# Patient Record
Sex: Male | Born: 1975 | Race: Black or African American | Hispanic: No | Marital: Married | Smoking: Never smoker
Health system: Southern US, Community
[De-identification: ages and names within clinical notes are randomized; demographics above are authoritative.]

## PROBLEM LIST (undated history)

## (undated) DIAGNOSIS — Z21 Asymptomatic human immunodeficiency virus [HIV] infection status: Secondary | ICD-10-CM

## (undated) DIAGNOSIS — B20 Human immunodeficiency virus [HIV] disease: Secondary | ICD-10-CM

## (undated) HISTORY — PX: ABDOMINAL SURGERY: SHX537

## (undated) HISTORY — PX: HERNIA REPAIR: SHX51

---

## 2000-04-28 ENCOUNTER — Encounter: Admission: RE | Admit: 2000-04-28 | Discharge: 2000-04-28 | Payer: Self-pay | Admitting: Internal Medicine

## 2000-04-28 ENCOUNTER — Encounter: Payer: Self-pay | Admitting: Internal Medicine

## 2016-06-14 ENCOUNTER — Emergency Department (HOSPITAL_BASED_OUTPATIENT_CLINIC_OR_DEPARTMENT_OTHER): Payer: Worker's Compensation

## 2016-06-14 ENCOUNTER — Encounter (HOSPITAL_BASED_OUTPATIENT_CLINIC_OR_DEPARTMENT_OTHER): Payer: Self-pay | Admitting: Emergency Medicine

## 2016-06-14 ENCOUNTER — Emergency Department (HOSPITAL_BASED_OUTPATIENT_CLINIC_OR_DEPARTMENT_OTHER)
Admission: EM | Admit: 2016-06-14 | Discharge: 2016-06-14 | Disposition: A | Discharge status: Home / Self Care | Payer: Worker's Compensation | Attending: Emergency Medicine | Admitting: Emergency Medicine

## 2016-06-14 DIAGNOSIS — S82034A Nondisplaced transverse fracture of right patella, initial encounter for closed fracture: Secondary | ICD-10-CM | POA: Insufficient documentation

## 2016-06-14 DIAGNOSIS — Y9389 Activity, other specified: Secondary | ICD-10-CM | POA: Insufficient documentation

## 2016-06-14 DIAGNOSIS — W228XXA Striking against or struck by other objects, initial encounter: Secondary | ICD-10-CM | POA: Insufficient documentation

## 2016-06-14 DIAGNOSIS — Y929 Unspecified place or not applicable: Secondary | ICD-10-CM | POA: Insufficient documentation

## 2016-06-14 DIAGNOSIS — Y990 Civilian activity done for income or pay: Secondary | ICD-10-CM | POA: Insufficient documentation

## 2016-06-14 DIAGNOSIS — S8991XA Unspecified injury of right lower leg, initial encounter: Secondary | ICD-10-CM | POA: Diagnosis present

## 2016-06-14 DIAGNOSIS — Z21 Asymptomatic human immunodeficiency virus [HIV] infection status: Secondary | ICD-10-CM | POA: Insufficient documentation

## 2016-06-14 HISTORY — DX: Human immunodeficiency virus (HIV) disease: B20

## 2016-06-14 HISTORY — DX: Asymptomatic human immunodeficiency virus (hiv) infection status: Z21

## 2016-06-14 MED ORDER — HYDROCODONE-ACETAMINOPHEN 5-325 MG PO TABS: 1.0000 | ORAL_TABLET | Freq: Four times a day (QID) | ORAL | 0 refills | Status: AC | PRN

## 2016-06-14 MED ORDER — IBUPROFEN 600 MG PO TABS: 600.0000 mg | ORAL_TABLET | Freq: Four times a day (QID) | ORAL | 0 refills | Status: AC | PRN

## 2016-06-14 NOTE — ED Triage Notes (Signed)
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  Pt states pain worse today.

## 2016-06-14 NOTE — ED Provider Notes (Signed)
[facility] Provider [facility]   CSN: [id number] Arrival date & time: 06/14/16  1214     History   Chief Complaint Chief Complaint  Patient presents with  . Knee Injury    HPI Trevor Roberts is a 41 y.o. male.  HPI Trevor Abidesaac D Zieske is a 41 y.o. male with history of HIV disease, presents to emergency department complaining of right knee injury. 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 He reports immediate pain but was able to finish his shift. He states right after he was unable to put any weight on his leg and developed increased pain and swelling. He denies any numbness or weakness distal to the knee. He denies any other injuries. He states he tried ice and elevation, tried taking ibuprofen, states he is still not able to bend his knee or walk on his knee so came for evaluation.   Past Medical History:  Diagnosis Date  . HIV (human immunodeficiency virus infection) (HCC)     There are no active problems to display for this patient.   Past Surgical History:  Procedure Laterality Date  . ABDOMINAL SURGERY    . HERNIA REPAIR         Home Medications    Prior to Admission medications   Medication Sig Start Date End Date Taking? Authorizing Provider  Elviteg-Cobic-Emtricit-TenofAF (GENVOYA PO) Take by mouth.   Yes Historical Provider, MD    Family History No family history on file.  Social History Social History  Substance Use Topics  . Smoking status: Never Smoker  . Smokeless tobacco: Never Used  . Alcohol use Yes     Comment: occ     Allergies   Patient has no known allergies.   Review of Systems Review of Systems  Constitutional: Negative for chills and fever.  Respiratory: Negative for cough, chest tightness and shortness of breath.   Cardiovascular: Negative for chest pain, palpitations and leg swelling.  Musculoskeletal: Positive for arthralgias and joint swelling. Negative  for myalgias, neck pain and neck stiffness.  Skin: Negative for rash.  Allergic/Immunologic: Negative for immunocompromised state.  Neurological: Negative for weakness and numbness.  All other systems reviewed and are negative.    Physical Exam Updated Vital Signs BP 124/82 (BP Location: Left Arm)   Pulse 76   Temp 99.4 F (37.4 C) (Oral)   Resp 18   Ht 5\' 4"  (1.626 m)   Wt 65.8 kg   SpO2 99%   BMI 24.89 kg/m   Physical Exam  Constitutional: He appears well-developed and well-nourished. No distress.  HENT:  Head: Normocephalic and atraumatic.  Eyes: Conjunctivae are normal.  Neck: Neck supple.  Cardiovascular: Normal rate, regular rhythm and normal heart sounds.   Pulmonary/Chest: Effort normal. No respiratory distress. He has no wheezes. He has no rales.  Musculoskeletal: He exhibits no edema.  Obvious swelling to the right knee. Tenderness to palpation over the patella bone and patellar tendon. Tenderness extends into the right quadricep muscle. Patient is unable to extend his knee actively. Full range of motion passively with pain with flexion. Negative anterior and posterior drawer signs. No laxity with medial lateral stress. Distal radial pulses intact. Normal hip and ankle.  Neurological: He is alert.  Skin: Skin is warm and dry.  Nursing note and vitals reviewed.    ED Treatments / Results  Labs (all labs ordered are listed, but only  abnormal results are displayed) Labs Reviewed - No data to display  EKG  EKG Interpretation None       Radiology Dg Knee Complete 4 Views Right  Result Date: 06/14/2016 CLINICAL DATA:  41 year old male with a history of injury EXAM: RIGHT KNEE - COMPLETE 4+ VIEW COMPARISON:  None. FINDINGS: Acute transverse fracture through the patella with associated soft tissue swelling. No acute displaced fracture of visualize fibula, tibia, or femur. No radiopaque foreign body. Joint effusion. IMPRESSION: Acute transverse fracture of the  patella with associated soft tissue swelling and joint effusion. Electronically Signed   By: Gilmer Mor D.O.   On: 06/14/2016 13:20    Procedures Procedures (including critical care time)  Medications Ordered in ED Medications - No data to display   Initial Impression / Assessment and Plan / ED Course  I have reviewed the triage vital signs and the nursing notes.  Pertinent labs & imaging results that were available during my care of the patient were reviewed by me and considered in my medical decision making (see chart for details).     Patient in emergency department with right knee injury. X-ray showing acute transverse fracture of the patella. I am concerned about patella tendon injury as well given quadricep pain and inability to extend his knee actively. He is neurovascularly intact. We'll place in a knee immobilizer. Will give Norco and ibuprofen for pain. Given referral to orthopedic specialist, explained that he will most likely require surgery to fix this injury. Patient voiced understanding.  Vitals:   06/14/16 1221  BP: 124/82  Pulse: 76  Resp: 18  Temp: 99.4 F (37.4 C)  TempSrc: Oral  SpO2: 99%  Weight: 65.8 kg  Height: 5\' 4"  (1.626 m)     Final Clinical Impressions(s) / ED Diagnoses   Final diagnoses:  Closed nondisplaced transverse fracture of right patella, initial encounter    New Prescriptions New Prescriptions   No medications on file     Jaynie Crumble, PA-C 06/14/16 1345    Melene Plan, DO 06/14/16 1502

## 2016-06-14 NOTE — Discharge Instructions (Signed)
Use crutches for ambulation. Keep immobilizer on at all times except for when taking a bath. Ice and elevate your  leg. Ibuprofen for pain. Norco for severe pain. It is very important that you follow-up with orthopedic specialist because this type of injury will possibly require surgical repair.

## 2017-04-21 ENCOUNTER — Emergency Department (HOSPITAL_BASED_OUTPATIENT_CLINIC_OR_DEPARTMENT_OTHER): Payer: Self-pay

## 2017-04-21 ENCOUNTER — Other Ambulatory Visit: Payer: Self-pay

## 2017-04-21 ENCOUNTER — Encounter (HOSPITAL_BASED_OUTPATIENT_CLINIC_OR_DEPARTMENT_OTHER): Payer: Self-pay | Admitting: *Deleted

## 2017-04-21 ENCOUNTER — Emergency Department (HOSPITAL_BASED_OUTPATIENT_CLINIC_OR_DEPARTMENT_OTHER)
Admission: EM | Admit: 2017-04-21 | Discharge: 2017-04-21 | Disposition: A | Discharge status: Home / Self Care | Payer: Self-pay | Attending: Emergency Medicine | Admitting: Emergency Medicine

## 2017-04-21 DIAGNOSIS — Z79899 Other long term (current) drug therapy: Secondary | ICD-10-CM | POA: Insufficient documentation

## 2017-04-21 DIAGNOSIS — Z21 Asymptomatic human immunodeficiency virus [HIV] infection status: Secondary | ICD-10-CM | POA: Insufficient documentation

## 2017-04-21 DIAGNOSIS — J069 Acute upper respiratory infection, unspecified: Secondary | ICD-10-CM | POA: Insufficient documentation

## 2017-04-21 DIAGNOSIS — E860 Dehydration: Secondary | ICD-10-CM | POA: Insufficient documentation

## 2017-04-21 DIAGNOSIS — R42 Dizziness and giddiness: Secondary | ICD-10-CM

## 2017-04-21 LAB — COMPREHENSIVE METABOLIC PANEL
ALK PHOS: 70 U/L (ref 38–126)
ALT: 19 U/L (ref 17–63)
AST: 19 U/L (ref 15–41)
Albumin: 4.4 g/dL (ref 3.5–5.0)
Anion gap: 5 (ref 5–15)
BILIRUBIN TOTAL: 0.9 mg/dL (ref 0.3–1.2)
BUN: 11 mg/dL (ref 6–20)
CALCIUM: 8.9 mg/dL (ref 8.9–10.3)
CO2: 27 mmol/L (ref 22–32)
CREATININE: 0.74 mg/dL (ref 0.61–1.24)
Chloride: 105 mmol/L (ref 101–111)
GFR calc non Af Amer: >60 mL/min (ref >60)
Glucose, Bld: 112 mg/dL — ABNORMAL HIGH (ref 65–99)
Potassium: 3.3 mmol/L — ABNORMAL LOW (ref 3.5–5.1)
Sodium: 137 mmol/L (ref 135–145)
TOTAL PROTEIN: 7.4 g/dL (ref 6.5–8.1)

## 2017-04-21 LAB — CBC WITH DIFFERENTIAL/PLATELET
Basophils Absolute: 0 10*3/uL (ref 0.0–0.1)
Basophils Relative: 0 %
EOS PCT: 1 %
Eosinophils Absolute: 0 10*3/uL (ref 0.0–0.7)
HCT: 44.1 % (ref 39.0–52.0)
Hemoglobin: 14.9 g/dL (ref 13.0–17.0)
LYMPHS ABS: 3.2 10*3/uL (ref 0.7–4.0)
LYMPHS PCT: 56 %
MCH: 30.4 pg (ref 26.0–34.0)
MCHC: 33.8 g/dL (ref 30.0–36.0)
MCV: 90 fL (ref 78.0–100.0)
MONOS PCT: 7 %
Monocytes Absolute: 0.4 10*3/uL (ref 0.1–1.0)
NEUTROS PCT: 36 %
Neutro Abs: 2 10*3/uL (ref 1.7–7.7)
Platelets: 187 10*3/uL (ref 150–400)
RBC: 4.9 MIL/uL (ref 4.22–5.81)
RDW: 13.6 % (ref 11.5–15.5)
WBC: 5.5 10*3/uL (ref 4.0–10.5)

## 2017-04-21 LAB — INFLUENZA PANEL BY PCR (TYPE A & B)
INFLAPCR: NEGATIVE
INFLBPCR: NEGATIVE

## 2017-04-21 MED ORDER — SODIUM CHLORIDE 0.9 % IV BOLUS (SEPSIS)
1000.0000 mL | Freq: Once | INTRAVENOUS | Status: AC
  Administered 2017-04-21: 1000 mL via INTRAVENOUS

## 2017-04-21 NOTE — ED Triage Notes (Signed)
Dizziness for a week. He was seen by his MD last week and treated for a URI with antibiotics. He finished his antibiotics and went back to work today. Dizziness returned while at work. His BP was taken by the nurse at work and was elevated. He was told to come to the ED for an evaluation.

## 2017-04-21 NOTE — ED Notes (Signed)
ED Provider at bedside. 

## 2017-04-21 NOTE — Discharge Instructions (Signed)
Your workup today showed no evidence of pneumonia.  We suspect you are dehydrated causing her lightheadedness.  Please stay hydrated.  If any symptoms change or worsen, please return to the nearest emergency department.  Please follow-up with your primary doctor in several days.

## 2017-04-21 NOTE — ED Provider Notes (Signed)
[facility] EMERGENCY DEPARTMENT Provider Note   CSN: [id number] Arrival date & time: 04/21/17  1312     History   Chief Complaint Chief Complaint  Patient presents with  . Dizziness    HPI Trevor Roberts is a 42 y.o. male.  The history is provided by the patient and medical records.  Dizziness  Quality:  Lightheadedness Severity:  Moderate Onset quality:  Gradual Duration:  3 days Timing:  Constant Progression:  Waxing and waning Chronicity:  New Relieved by:  Nothing Worsened by:  Nothing Ineffective treatments:  None tried Associated symptoms: nausea   Associated symptoms: no chest pain, no diarrhea, no headaches, no palpitations, no shortness of breath, no syncope, no vomiting and no weakness   URI   This is a new problem. The current episode started more than 2 days ago. The problem has not changed since onset.There has been no fever. Associated symptoms include nausea, congestion, sneezing and cough. Pertinent negatives include no chest pain, no abdominal pain, no diarrhea, no vomiting, no headaches, no neck pain, no rash and no wheezing. He has tried nothing for the symptoms.    Past Medical History:  Diagnosis Date  . HIV (human immunodeficiency virus infection) (HCC)     There are no active problems to display for this patient.   Past Surgical History:  Procedure Laterality Date  . ABDOMINAL SURGERY    . HERNIA REPAIR         Home Medications    Prior to Admission medications   Medication Sig Start Date End Date Taking? Authorizing Provider  Elviteg-Cobic-Emtricit-TenofAF (GENVOYA PO) Take by mouth.   Yes [provider]  HYDROcodone-acetaminophen (NORCO) 5-325 MG tablet Take 1 tablet by mouth every 6 (six) hours as needed for moderate pain. 06/14/16   Kirichenko, Tatyana, PA-C  ibuprofen (ADVIL,MOTRIN) 600 MG tablet Take 1 tablet (600 mg total) by mouth every 6 (six) hours as needed. 06/14/16   Jaynie Crumble, PA-C     Family History No family history on file.  Social History Social History   Tobacco Use  . Smoking status: Never Smoker  . Smokeless tobacco: Never Used  Substance Use Topics  . Alcohol use: Yes    Comment: occ  . Drug use: Not on file     Allergies   Patient has no known allergies.   Review of Systems Review of Systems  Constitutional: Positive for chills and fatigue. Negative for appetite change, diaphoresis and fever.  HENT: Positive for congestion and sneezing.   Respiratory: Positive for cough. Negative for chest tightness, shortness of breath, wheezing and stridor.   Cardiovascular: Negative for chest pain, palpitations, leg swelling and syncope.  Gastrointestinal: Positive for nausea. Negative for abdominal pain, diarrhea and vomiting.  Genitourinary: Negative for flank pain and frequency.  Musculoskeletal: Negative for back pain, neck pain and neck stiffness.  Skin: Negative for rash.  Neurological: Positive for light-headedness. Negative for dizziness, facial asymmetry, weakness and headaches.  All other systems reviewed and are negative.    Physical Exam Updated Vital Signs BP 132/87   Pulse 68   Temp 98.3 F (36.8 C) (Oral)   Resp 14   Ht 5\' 4"  (1.626 m)   Wt 65.8 kg (145 lb)   SpO2 98%   BMI 24.89 kg/m   Physical Exam  Constitutional: He is oriented to person, place, and time. He appears well-developed and well-nourished. No distress.  HENT:  Head: Normocephalic and atraumatic.  Right Ear: External ear normal.  Left Ear: External ear normal.  Nose: Nose normal.  Mouth/Throat: Oropharynx is clear and moist. No oropharyngeal exudate.  Eyes: Conjunctivae and EOM are normal. Pupils are equal, round, and reactive to light.  Neck: Normal range of motion. Neck supple.  Cardiovascular: Normal rate and intact distal pulses.  No murmur heard. Pulmonary/Chest: No stridor. No respiratory distress. He has no wheezes. He has no rales. He exhibits no  tenderness.  Abdominal: Soft. There is no tenderness. There is no rebound and no guarding.  Musculoskeletal: He exhibits no edema or tenderness.  Neurological: He is alert and oriented to person, place, and time. He displays normal reflexes. No cranial nerve deficit. He exhibits normal muscle tone. Coordination normal.  Skin: Skin is warm. Capillary refill takes less than 2 seconds. No rash noted. He is not diaphoretic. No erythema. No pallor.  Psychiatric: He has a normal mood and affect.  Nursing note and vitals reviewed.    ED Treatments / Results  Labs (all labs ordered are listed, but only abnormal results are displayed) Labs Reviewed  COMPREHENSIVE METABOLIC PANEL - Abnormal; Notable for the following components:      Result Value   Potassium 3.3 (*)    Glucose, Bld 112 (*)    All other components within normal limits  CBC WITH DIFFERENTIAL/PLATELET  INFLUENZA PANEL BY PCR (TYPE A & B)    EKG  EKG Interpretation  Date/Time:  Tuesday April 21 2017 17:40:09 EST Ventricular Rate:  64 PR Interval:    QRS Duration: 99 QT Interval:  393 QTC Calculation: 406 R Axis:   -13 Text Interpretation:  Sinus rhythm Abnormal R-wave progression, early transition No prior ECG for comparison.  No STEMI Confirmed by Theda Belfastegeler, Chris ([id number]) on 04/21/2017 5:46:08 PM       Radiology Dg Chest 2 View  Result Date: 04/21/2017 CLINICAL DATA:  Chills, cough, dizziness EXAM: CHEST  2 VIEW COMPARISON:  None. FINDINGS: Heart and mediastinal contours are within normal limits. No focal opacities or effusions. No acute bony abnormality. Old healed distal left clavicle fracture. IMPRESSION: No active cardiopulmonary disease. Electronically Signed   By: Charlett NoseKevin  Dover M.D.   On: 04/21/2017 18:01    Procedures Procedures (including critical care time)  Medications Ordered in ED Medications  sodium chloride 0.9 % bolus 1,000 mL (0 mLs Intravenous Stopped 04/21/17 2023)  sodium chloride 0.9 % bolus  1,000 mL (0 mLs Intravenous Stopped 04/21/17 2024)     Initial Impression / Assessment and Plan / ED Course  I have reviewed the triage vital signs and the nursing notes.  Pertinent labs & imaging results that were available during my care of the patient were reviewed by me and considered in my medical decision making (see chart for details).     Myrle Shengsaac D Surber is a 42 y.o. male with a past medical history significant for well-controlled HIV who presents with lightheadedness, fevers, chills, productive cough, congestion, myalgias, and nausea.  Patient reports that he has been having symptoms are proximal he 1 week that have been continuous.  He reports that he completed a Z-Pak of azithromycin his PCP prescribed him several days ago for likely URI.  He went back to work today and was feeling lightheaded and still having a mucus production cough.  He reports continuous rhinorrhea and congestion as well as nausea.  He says he has not been eating he denies any urinary changes, cuts patient, or diarrhea.  He is unsure of sick contacts but he works [employer]  service and is likely in contact with ill customers.  According to patient, today when he was feeling lightheaded, they checked his blood pressure was in the 150s.  This caused the patient concern and also prompted him to come to the ED.  On exam, patient is afebrile on arrival.  Patient's lungs were clear on my evaluation.  Abdomen and chest were nontender.  Patient had no focal neurologic deficits.  Patient did stand however began feeling lightheaded and had to sit back down.  I am concerned patient is likely dehydrated.  Next  Patient had workup including laboratory testing and chest x-ray to look for development of pneumonia as he is Artie completed a Z-Pak.  Patient reports that he is well controlled and HIV, will hold on checking this today.  Will also check for influenza given the amount of cases seen in this area recently, his continued symptoms  and productive cough.  Patient was given fluids for lightheadedness.  Anticipate discharge if workup is reassuring.  Patient reports his symptoms have resolved after fluids.  No further nausea or lightheadedness.  Laboratory testing overall reassuring.  Influenza test negative.  CBC and CMP reassuring.  Chest x-ray shows no pneumonia.  Suspect viral URI and dehydration.  Patient instructed to stay hydrated.  Patient will follow up with PCP in several days and understood return precautions.  Patient had no other questions or concerns and was discharged in good condition.  Final Clinical Impressions(s) / ED Diagnoses   Final diagnoses:  Dehydration  Lightheadedness  Upper respiratory tract infection, unspecified type    ED Discharge Orders    None     Clinical Impression: 1. Dehydration   2. Lightheadedness   3. Upper respiratory tract infection, unspecified type     Disposition: Discharge  Condition: Good  I have discussed the results, Dx and Tx plan with the pt(& family if present). He/she/they expressed understanding and agree(s) with the plan. Discharge instructions discussed at great length. Strict return precautions discussed and pt &/or family have verbalized understanding of the instructions. No further questions at time of discharge.    Discharge Medication List as of 04/21/2017  9:31 PM      Follow Up: [facility] [address] [place] [address] [phone] Schedule an appointment as soon as possible for a visit    [facility] [place] [facility] [address] [id number] [address] [place] [address] [phone]       Tegeler, Canary Brim, MD 04/22/17 [phone]

## 2019-10-09 ENCOUNTER — Emergency Department (HOSPITAL_BASED_OUTPATIENT_CLINIC_OR_DEPARTMENT_OTHER)
Admission: EM | Admit: 2019-10-09 | Discharge: 2019-10-09 | Disposition: A | Discharge status: Home / Self Care | Payer: Self-pay | Attending: Emergency Medicine | Admitting: Emergency Medicine

## 2019-10-09 ENCOUNTER — Other Ambulatory Visit: Payer: Self-pay

## 2019-10-09 DIAGNOSIS — S161XXA Strain of muscle, fascia and tendon at neck level, initial encounter: Secondary | ICD-10-CM | POA: Insufficient documentation

## 2019-10-09 DIAGNOSIS — Y929 Unspecified place or not applicable: Secondary | ICD-10-CM | POA: Insufficient documentation

## 2019-10-09 DIAGNOSIS — Y939 Activity, unspecified: Secondary | ICD-10-CM | POA: Insufficient documentation

## 2019-10-09 DIAGNOSIS — Y999 Unspecified external cause status: Secondary | ICD-10-CM | POA: Insufficient documentation

## 2019-10-09 DIAGNOSIS — X58XXXA Exposure to other specified factors, initial encounter: Secondary | ICD-10-CM | POA: Insufficient documentation

## 2019-10-09 MED ORDER — CYCLOBENZAPRINE HCL 10 MG PO TABS: 10.0000 mg | ORAL_TABLET | Freq: Two times a day (BID) | ORAL | 0 refills | Status: AC | PRN

## 2019-10-09 MED ORDER — KETOROLAC TROMETHAMINE 60 MG/2ML IM SOLN
60.0000 mg | Freq: Once | INTRAMUSCULAR | Status: AC
  Administered 2019-10-09: 60 mg via INTRAMUSCULAR
  Filled 2019-10-09: qty 2

## 2019-10-09 NOTE — Discharge Instructions (Addendum)
Follow-up with primary care doctor if pain persist.  Recommend 800 mg of Motrin every 8 hours for the next 4 to 5 days and then as needed.  Recommend 1000 mg of Tylenol every 6 hours for the next 4 to 5 days and then as needed.  Take muscle relaxant as needed but do not mix with alcohol or driving.  This medication is sedating.
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
## 2019-11-15 ENCOUNTER — Other Ambulatory Visit: Payer: Self-pay

## 2019-11-15 ENCOUNTER — Emergency Department (HOSPITAL_BASED_OUTPATIENT_CLINIC_OR_DEPARTMENT_OTHER)
Admission: EM | Admit: 2019-11-15 | Discharge: 2019-11-15 | Disposition: A | Discharge status: Home / Self Care | Payer: BLUE CROSS/BLUE SHIELD | Attending: Emergency Medicine | Admitting: Emergency Medicine

## 2019-11-15 ENCOUNTER — Encounter (HOSPITAL_BASED_OUTPATIENT_CLINIC_OR_DEPARTMENT_OTHER): Payer: Self-pay

## 2019-11-15 ENCOUNTER — Emergency Department (HOSPITAL_BASED_OUTPATIENT_CLINIC_OR_DEPARTMENT_OTHER): Payer: BLUE CROSS/BLUE SHIELD

## 2019-11-15 DIAGNOSIS — R0789 Other chest pain: Secondary | ICD-10-CM

## 2019-11-15 LAB — CBC
HCT: 46.5 % (ref 39.0–52.0)
Hemoglobin: 15.5 g/dL (ref 13.0–17.0)
MCH: 30.3 pg (ref 26.0–34.0)
MCHC: 33.3 g/dL (ref 30.0–36.0)
MCV: 91 fL (ref 80.0–100.0)
Platelets: 226 10*3/uL (ref 150–400)
RBC: 5.11 MIL/uL (ref 4.22–5.81)
RDW: 12.4 % (ref 11.5–15.5)
WBC: 4.4 10*3/uL (ref 4.0–10.5)
nRBC: 0 % (ref 0.0–0.2)

## 2019-11-15 LAB — BASIC METABOLIC PANEL
Anion gap: 10 (ref 5–15)
BUN: 8 mg/dL (ref 6–20)
CO2: 26 mmol/L (ref 22–32)
Calcium: 9 mg/dL (ref 8.9–10.3)
Chloride: 104 mmol/L (ref 98–111)
Creatinine, Ser: 0.97 mg/dL (ref 0.61–1.24)
GFR calc Af Amer: >60 mL/min (ref >60)
GFR calc non Af Amer: >60 mL/min (ref >60)
Glucose, Bld: 103 mg/dL — ABNORMAL HIGH (ref 70–99)
Potassium: 3.3 mmol/L — ABNORMAL LOW (ref 3.5–5.1)
Sodium: 140 mmol/L (ref 135–145)

## 2019-11-15 LAB — TROPONIN I (HIGH SENSITIVITY): Troponin I (High Sensitivity): 2 ng/L (ref <18)

## 2019-11-15 MED ORDER — NAPROXEN 375 MG PO TABS: 375.0000 mg | ORAL_TABLET | Freq: Two times a day (BID) | ORAL | 0 refills | Status: AC

## 2019-11-15 NOTE — ED Provider Notes (Signed)
[facility] EMERGENCY DEPARTMENT Provider Note   CSN: [id number] Arrival date & time: 11/15/19  1533     History Chief Complaint  Patient presents with   Chest Pain    Trevor Roberts is a 44 y.o. male.  HPI Patient will start developing chest pain earlier today.  He reports that he was getting ready to go to work and the pain started.  It is a pressure and aching quality on the left side of his chest diffusely.  No radiation.  No associated symptoms.  Not worse or changed by deep inspiration.  Patient was diagnosed with Covid August 10.  He reports he thinks he was symptomatic about 2 days before that.  He has not been having any ongoing problems with worsening shortness of breath, ongoing fevers or body ache or lower extremity swelling or pain.  Patient has HIV and takes his Genvoya as prescribed.  He otherwise is healthy.    Past Medical History:  Diagnosis Date   HIV (human immunodeficiency virus infection) (HCC)     There are no problems to display for this patient.   Past Surgical History:  Procedure Laterality Date   ABDOMINAL SURGERY     HERNIA REPAIR         No family history on file.  Social History   Tobacco Use   Smoking status: Never Smoker   Smokeless tobacco: Never Used  Substance Use Topics   Alcohol use: Yes    Comment: occ   Drug use: Yes    Types: Marijuana    Home Medications Prior to Admission medications   Medication Sig Start Date End Date Taking? Authorizing Provider  cyclobenzaprine (FLEXERIL) 10 MG tablet Take 1 tablet (10 mg total) by mouth 2 (two) times daily as needed for up to 20 doses for muscle spasms. 10/09/19   Curatolo, Adam, DO  Elviteg-Cobic-Emtricit-TenofAF (GENVOYA PO) Take by mouth.    [provider]  HYDROcodone-acetaminophen (NORCO) 5-325 MG tablet Take 1 tablet by mouth every 6 (six) hours as needed for moderate pain. 06/14/16   Kirichenko, Tatyana, PA-C  ibuprofen (ADVIL,MOTRIN) 600 MG tablet  Take 1 tablet (600 mg total) by mouth every 6 (six) hours as needed. 06/14/16   Kirichenko, Tatyana, PA-C  naproxen (NAPROSYN) 375 MG tablet Take 1 tablet (375 mg total) by mouth 2 (two) times daily. 11/15/19   Arby Barrette, MD    Allergies    Patient has no known allergies.  Review of Systems   Review of Systems 10 systems reviewed and negative except as per HPI Physical Exam Updated Vital Signs BP 120/79    Pulse 65    Temp 98.8 F (37.1 C) (Oral)    Resp 13    Ht 5\' 4"  (1.626 m)    Wt 63.5 kg    SpO2 99%    BMI 24.03 kg/m   Physical Exam Constitutional:      Appearance: He is well-developed.  HENT:     Head: Normocephalic and atraumatic.  Cardiovascular:     Rate and Rhythm: Normal rate and regular rhythm.     Heart sounds: Normal heart sounds.  Pulmonary:     Effort: Pulmonary effort is normal.     Breath sounds: Normal breath sounds.  Abdominal:     General: Bowel sounds are normal. There is no distension.     Palpations: Abdomen is soft.     Tenderness: There is no abdominal tenderness.  Musculoskeletal:  General: No swelling or tenderness. Normal range of motion.     Cervical back: Neck supple.     Right lower leg: No edema.     Left lower leg: No edema.  Skin:    General: Skin is warm and dry.  Neurological:     Mental Status: He is alert and oriented to person, place, and time.     GCS: GCS eye subscore is 4. GCS verbal subscore is 5. GCS motor subscore is 6.     Coordination: Coordination normal.  Psychiatric:        Mood and Affect: Mood normal.     ED Results / Procedures / Treatments   Labs (all labs ordered are listed, but only abnormal results are displayed) Labs Reviewed  BASIC METABOLIC PANEL - Abnormal; Notable for the following components:      Result Value   Potassium 3.3 (*)    Glucose, Bld 103 (*)    All other components within normal limits  CBC  TROPONIN I (HIGH SENSITIVITY)  TROPONIN I (HIGH SENSITIVITY)    EKG EKG  Interpretation  Date/Time:  Tuesday November 15 2019 16:07:09 EDT Ventricular Rate:  59 PR Interval:  146 QRS Duration: 100 QT Interval:  400 QTC Calculation: 396 R Axis:   -17 Text Interpretation: Sinus bradycardia Incomplete right bundle branch block Nonspecific ST and T wave abnormality Abnormal ECG no change from previouis Confirmed by Arby Barrette [phone]) on 11/15/2019 5:19:23 PM   Radiology DG Chest Portable 1 View  Result Date: 11/15/2019 CLINICAL DATA:  Negative. EXAM: PORTABLE CHEST 1 VIEW COMPARISON:  04/21/2017 FINDINGS: The heart size and mediastinal contours are within normal limits. Both lungs are clear. The visualized skeletal structures are unremarkable. IMPRESSION: No active disease. Electronically Signed   By: Charlett Nose M.D.   On: 11/15/2019 16:44    Procedures Procedures (including critical care time)  Medications Ordered in ED Medications - No data to display  ED Course  I have reviewed the triage vital signs and the nursing notes.  Pertinent labs & imaging results that were available during my care of the patient were reviewed by me and considered in my medical decision making (see chart for details).    MDM Rules/Calculators/A&P                          Patient reports the onset of chest pain today.  Left-sided general achiness.  No associated symptoms or radiation.  Patient is low risk for cardiac ischemic disease.  His troponin is normal and EKG without any changes.  Patient does have diagnosis of Covid from 2 weeks ago.  At this time, no signs of complications.  Vital signs are normal without tachycardia or hypoxia.  Respirations are nonlabored and lungs are clear.  Chest x-ray is clear.  Patient does not have signs of pulmonary embolus.  He does not have lower extremity swelling or calf pain.  No tachycardia and no hypoxia.  Low suspicion for acute PE as etiology for chest pain.  Higher suspicion for pleurisy or musculoskeletal chest wall pain.  At this  time will treat with low-dose naproxen for limited course.  Patient is counseled on return precautions and close follow-up with PCP or ID physician. Patient providedFinal Clinical Impression(s) / ED Diagnoses Final diagnoses:  Atypical chest pain    Rx / DC Orders ED Discharge Orders         Ordered    naproxen (NAPROSYN) 375  MG tablet  2 times daily        11/15/19 1810           Arby Barrette, MD 11/15/19 [phone]

## 2019-11-15 NOTE — ED Triage Notes (Signed)
Pt arrives with c/o CP starting today in the middle pain has since moved to the left side, states he was getting ready for work when the pain started. Pt reports he is Covid positive.

## 2019-11-15 NOTE — ED Notes (Signed)
States, " The air conditioning makes my chest pain worse"

## 2019-11-15 NOTE — Discharge Instructions (Signed)
1.  Follow-up with your infectious disease doctor or family doctor for recheck within 5 to 7 days. 2.  You may take a short course of low-dose naproxen for pain.  Do not take it for more than 5 days as prescribed due to potential interaction with your Genvoya. 3.  Return to the emergency department if you have new or worsening symptoms.

## 2020-10-06 IMAGING — DX DG CHEST 1V PORT
1 series · 1 of 1 positions shown · non-contrast
Comparison: 04/21/2017

CLINICAL DATA: Negative.

EXAM:
PORTABLE CHEST 1 VIEW

[chest ap]
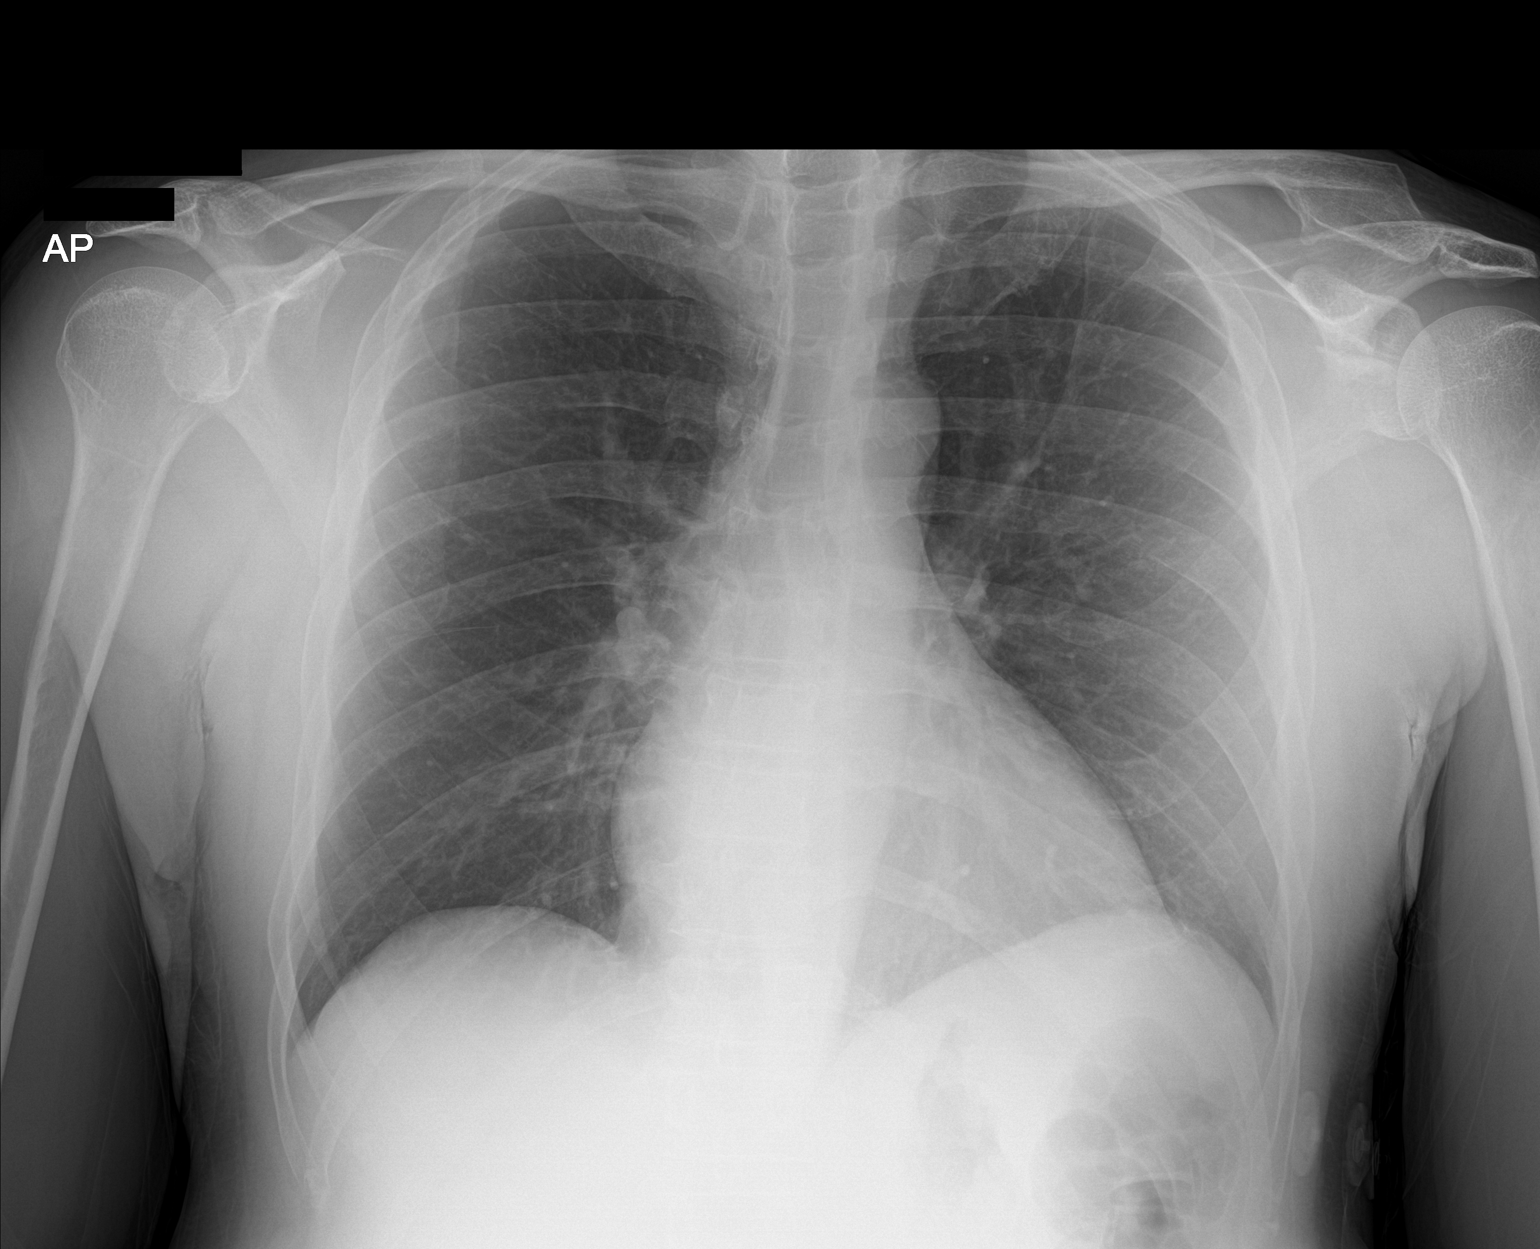

[1 of 1 positions shown; findings below may reference images not displayed]

FINDINGS: The heart size and mediastinal contours are within normal limits.
Both lungs are clear. The visualized skeletal structures are
unremarkable.
IMPRESSION: No active disease.

## 2021-06-10 ENCOUNTER — Emergency Department (HOSPITAL_BASED_OUTPATIENT_CLINIC_OR_DEPARTMENT_OTHER)
Admission: EM | Admit: 2021-06-10 | Discharge: 2021-06-10 | Disposition: A | Discharge status: Home / Self Care | Payer: BLUE CROSS/BLUE SHIELD | Attending: Emergency Medicine | Admitting: Emergency Medicine

## 2021-06-10 ENCOUNTER — Other Ambulatory Visit: Payer: Self-pay

## 2021-06-10 ENCOUNTER — Encounter (HOSPITAL_BASED_OUTPATIENT_CLINIC_OR_DEPARTMENT_OTHER): Payer: Self-pay | Admitting: *Deleted

## 2021-06-10 DIAGNOSIS — Z20822 Contact with and (suspected) exposure to covid-19: Secondary | ICD-10-CM | POA: Insufficient documentation

## 2021-06-10 DIAGNOSIS — J029 Acute pharyngitis, unspecified: Secondary | ICD-10-CM | POA: Insufficient documentation

## 2021-06-10 LAB — GROUP A STREP BY PCR: Group A Strep by PCR: NOT DETECTED

## 2021-06-10 LAB — RESP PANEL BY RT-PCR (FLU A&B, COVID) ARPGX2
Influenza A by PCR: NEGATIVE
Influenza B by PCR: NEGATIVE
SARS Coronavirus 2 by RT PCR: NEGATIVE

## 2021-06-10 MED ORDER — DEXAMETHASONE 4 MG PO TABS
10.0000 mg | ORAL_TABLET | Freq: Once | ORAL | Status: AC
  Administered 2021-06-10: 10 mg via ORAL
  Filled 2021-06-10: qty 3

## 2021-06-10 NOTE — ED Triage Notes (Signed)
He woke 3 days ago with laryngitis.  ?

## 2021-06-10 NOTE — ED Notes (Signed)
Reviewed discharge instructions with pt. Pt states understanding 

## 2021-06-10 NOTE — ED Provider Notes (Signed)
?[facility] EMERGENCY DEPARTMENT ?Provider Note ? ? ?CSN: [id number] ?Arrival date & time: 06/10/21  1222 ? ?  ? ?History ? ?Chief Complaint  ?Patient presents with  ? Laryngitis  ? ? ?Trevor Roberts is a 46 y.o. male. ? ?The history is provided by the patient.  ?Sore Throat ?This is a new problem. The current episode started more than 2 days ago. The problem occurs daily. The problem has not changed since onset.Pertinent negatives include no chest pain, no abdominal pain, no headaches and no shortness of breath. Nothing aggravates the symptoms. Nothing relieves the symptoms. He has tried nothing for the symptoms. The treatment provided no relief.  ? ?  ? ?Home Medications ?Prior to Admission medications   ?Medication Sig Start Date End Date Taking? Authorizing Provider  ?amitriptyline (ELAVIL) 10 MG tablet Take by mouth. 04/30/21  Yes [provider]  ?Elviteg-Cobic-Emtricit-TenofAF (GENVOYA PO) Take by mouth.   Yes [provider]  ?cyclobenzaprine (FLEXERIL) 10 MG tablet Take 1 tablet (10 mg total) by mouth 2 (two) times daily as needed for up to 20 doses for muscle spasms. 10/09/19   Maleeah Crossman, DO  ?HYDROcodone-acetaminophen (NORCO) 5-325 MG tablet Take 1 tablet by mouth every 6 (six) hours as needed for moderate pain. 06/14/16   Kirichenko, Lemont Fillers, PA-C  ?ibuprofen (ADVIL,MOTRIN) 600 MG tablet Take 1 tablet (600 mg total) by mouth every 6 (six) hours as needed. 06/14/16   Kirichenko, Lemont Fillers, PA-C  ?naproxen (NAPROSYN) 375 MG tablet Take 1 tablet (375 mg total) by mouth 2 (two) times daily. 11/15/19   Arby Barrette, MD  ?   ? ?Allergies    ?Patient has no known allergies.   ? ?Review of Systems   ?Review of Systems  ?Respiratory:  Negative for shortness of breath.   ?Cardiovascular:  Negative for chest pain.  ?Gastrointestinal:  Negative for abdominal pain.  ?Neurological:  Negative for headaches.  ? ?Physical Exam ?Updated Vital Signs ?BP (!) 133/93 (BP Location: Right Arm)    Pulse 88   Temp 98.4 ?F (36.9 ?C)   Resp 18   Ht 5\' 4"  (1.626 m)   Wt 62.6 kg   SpO2 98%   BMI 23.69 kg/m?  ?Physical Exam ?Vitals and nursing note reviewed.  ?Constitutional:   ?   General: He is not in acute distress. ?   Appearance: He is well-developed. He is not ill-appearing.  ?HENT:  ?   Head: Normocephalic and atraumatic.  ?   Nose: Nose normal. No congestion.  ?   Mouth/Throat:  ?   Mouth: Mucous membranes are moist.  ?   Pharynx: Posterior oropharyngeal erythema present. No oropharyngeal exudate.  ?Eyes:  ?   Extraocular Movements: Extraocular movements intact.  ?   Conjunctiva/sclera: Conjunctivae normal.  ?   Pupils: Pupils are equal, round, and reactive to light.  ?Cardiovascular:  ?   Rate and Rhythm: Normal rate and regular rhythm.  ?   Heart sounds: No murmur heard. ?Pulmonary:  ?   Effort: Pulmonary effort is normal. No respiratory distress.  ?   Breath sounds: Normal breath sounds.  ?Abdominal:  ?   Palpations: Abdomen is soft.  ?   Tenderness: There is no abdominal tenderness.  ?Musculoskeletal:     ?   General: No swelling.  ?   Cervical back: Normal range of motion and neck supple. No rigidity or tenderness.  ?Lymphadenopathy:  ?   Cervical: No cervical adenopathy.  ?Skin: ?   General: Skin  is warm and dry.  ?   Capillary Refill: Capillary refill takes less than 2 seconds.  ?Neurological:  ?   Mental Status: He is alert.  ?Psychiatric:     ?   Mood and Affect: Mood normal.  ? ? ?ED Results / Procedures / Treatments   ?Labs ?(all labs ordered are listed, but only abnormal results are displayed) ?Labs Reviewed  ?RESP PANEL BY RT-PCR (FLU A&B, COVID) [id number]  ?GROUP A STREP BY PCR  ? ? ?EKG ?None ? ?Radiology ?No results found. ? ?Procedures ?Procedures  ? ? ?Medications Ordered in ED ?Medications  ?dexamethasone (DECADRON) tablet 10 mg (10 mg Oral Given 06/10/21 1509)  ? ? ?ED Course/ Medical Decision Making/ A&P ?  ?                        ?Medical Decision Making ?Risk ?Prescription drug  management. ? ? ?Vasiliy Mccarry Marsico is here with sore throat.  No significant medical history.  Normal vitals.  No fever.  Has some erythema to the posterior oropharynx.  There is no major uvula swelling or tonsillar swelling.  Differential diagnosis is likely viral pharyngitis versus strep pharyngitis.  Low concern for deep space abscess including tonsillar or peritonsillar abscess.  Given a dose of Decadron.  We will call in antibiotic if strep test is positive.  Discharged in good condition. ? ?This chart was dictated using voice recognition software.  Despite best efforts to proofread,  errors can occur which can change the documentation meaning.  ? ? ? ? ? ? ? ?Final Clinical Impression(s) / ED Diagnoses ?Final diagnoses:  ?Sore throat  ? ? ?Rx / DC Orders ?ED Discharge Orders   ? ? None  ? ?  ? ? ?  ?Virgina Norfolk, DO ?06/10/21 1517 ? ?

## 2021-06-10 NOTE — Discharge Instructions (Signed)
Follow-up your strep and COVID test on your MyChart.  If your strep test is positive I will call in antibiotic.  You have been treated with a long-acting steroid which should help your discomfort.  Recommend 800 mg ibuprofen every 8 hours as needed for pain.  Recommend 1000 mg of Tylenol every 6 hours as needed for pain.  Please return if symptoms are not improving. ?
